# Patient Record
Sex: Female | Born: 1990 | Hispanic: Yes | Marital: Single | Smoking: Never smoker
Health system: Southern US, Community
[De-identification: ages and names within clinical notes are randomized; demographics above are authoritative.]

## PROBLEM LIST (undated history)

## (undated) DIAGNOSIS — Z789 Other specified health status: Secondary | ICD-10-CM

## (undated) HISTORY — PX: NO PAST SURGERIES: SHX2092

---
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
## 2015-06-08 ENCOUNTER — Emergency Department: Payer: BLUE CROSS/BLUE SHIELD

## 2015-06-08 ENCOUNTER — Observation Stay
Admission: EM | Admit: 2015-06-08 | Discharge: 2015-06-09 | Disposition: A | Discharge status: Home / Self Care | Payer: BLUE CROSS/BLUE SHIELD | Attending: Internal Medicine | Admitting: Internal Medicine

## 2015-06-08 ENCOUNTER — Encounter: Payer: Self-pay | Admitting: Emergency Medicine

## 2015-06-08 DIAGNOSIS — R7989 Other specified abnormal findings of blood chemistry: Secondary | ICD-10-CM

## 2015-06-08 DIAGNOSIS — Z79899 Other long term (current) drug therapy: Secondary | ICD-10-CM | POA: Diagnosis not present

## 2015-06-08 DIAGNOSIS — D72829 Elevated white blood cell count, unspecified: Secondary | ICD-10-CM | POA: Diagnosis not present

## 2015-06-08 DIAGNOSIS — R1013 Epigastric pain: Secondary | ICD-10-CM | POA: Diagnosis present

## 2015-06-08 DIAGNOSIS — R1011 Right upper quadrant pain: Secondary | ICD-10-CM

## 2015-06-08 DIAGNOSIS — R109 Unspecified abdominal pain: Secondary | ICD-10-CM | POA: Diagnosis not present

## 2015-06-08 DIAGNOSIS — R000 Tachycardia, unspecified: Principal | ICD-10-CM | POA: Insufficient documentation

## 2015-06-08 DIAGNOSIS — N390 Urinary tract infection, site not specified: Secondary | ICD-10-CM | POA: Diagnosis not present

## 2015-06-08 DIAGNOSIS — R112 Nausea with vomiting, unspecified: Secondary | ICD-10-CM | POA: Insufficient documentation

## 2015-06-08 DIAGNOSIS — E860 Dehydration: Secondary | ICD-10-CM | POA: Diagnosis not present

## 2015-06-08 DIAGNOSIS — K760 Fatty (change of) liver, not elsewhere classified: Secondary | ICD-10-CM | POA: Diagnosis not present

## 2015-06-08 HISTORY — DX: Other specified health status: Z78.9

## 2015-06-08 LAB — URINALYSIS COMPLETE WITH MICROSCOPIC (ARMC ONLY)
Bilirubin Urine: NEGATIVE
Glucose, UA: NEGATIVE mg/dL
HGB URINE DIPSTICK: NEGATIVE
KETONES UR: NEGATIVE mg/dL
NITRITE: NEGATIVE
PROTEIN: 30 mg/dL — AB
SPECIFIC GRAVITY, URINE: 1.024 (ref 1.005–1.030)
pH: 6 (ref 5.0–8.0)

## 2015-06-08 LAB — COMPREHENSIVE METABOLIC PANEL
ALK PHOS: 86 U/L (ref 38–126)
ALT: 23 U/L (ref 14–54)
ANION GAP: 12 (ref 5–15)
AST: 21 U/L (ref 15–41)
Albumin: 4 g/dL (ref 3.5–5.0)
BILIRUBIN TOTAL: 1 mg/dL (ref 0.3–1.2)
BUN: 10 mg/dL (ref 6–20)
CALCIUM: 8.8 mg/dL — AB (ref 8.9–10.3)
CO2: 22 mmol/L (ref 22–32)
Chloride: 99 mmol/L — ABNORMAL LOW (ref 101–111)
Creatinine, Ser: 0.7 mg/dL (ref 0.44–1.00)
Glucose, Bld: 119 mg/dL — ABNORMAL HIGH (ref 65–99)
POTASSIUM: 3.5 mmol/L (ref 3.5–5.1)
Sodium: 133 mmol/L — ABNORMAL LOW (ref 135–145)
TOTAL PROTEIN: 7.8 g/dL (ref 6.5–8.1)

## 2015-06-08 LAB — CBC
HCT: 44.7 % (ref 35.0–47.0)
HEMOGLOBIN: 15.2 g/dL (ref 12.0–16.0)
MCH: 29.7 pg (ref 26.0–34.0)
MCHC: 34.1 g/dL (ref 32.0–36.0)
MCV: 87.2 fL (ref 80.0–100.0)
Platelets: 239 10*3/uL (ref 150–440)
RBC: 5.13 MIL/uL (ref 3.80–5.20)
RDW: 13.7 % (ref 11.5–14.5)
WBC: 12.7 10*3/uL — AB (ref 3.6–11.0)

## 2015-06-08 LAB — TSH: TSH: 0.448 u[IU]/mL (ref 0.350–4.500)

## 2015-06-08 LAB — POCT PREGNANCY, URINE: Preg Test, Ur: NEGATIVE

## 2015-06-08 LAB — LIPASE, BLOOD: Lipase: 17 U/L (ref 11–51)

## 2015-06-08 LAB — FIBRIN DERIVATIVES D-DIMER (ARMC ONLY): Fibrin derivatives D-dimer (ARMC): 1709 — ABNORMAL HIGH (ref 0–499)

## 2015-06-08 LAB — TROPONIN I: Troponin I: <0.03 ng/mL (ref <0.031)

## 2015-06-08 MED ORDER — SODIUM CHLORIDE 0.9 % IV BOLUS (SEPSIS)
1000.0000 mL | Freq: Once | INTRAVENOUS | Status: AC
Start: 2015-06-08 — End: 2015-06-08
  Administered 2015-06-08: 1000 mL via INTRAVENOUS

## 2015-06-08 MED ORDER — SODIUM CHLORIDE 0.9 % IV BOLUS (SEPSIS)
1000.0000 mL | Freq: Once | INTRAVENOUS | Status: AC
  Administered 2015-06-08: 1000 mL via INTRAVENOUS

## 2015-06-08 MED ORDER — IOHEXOL 350 MG/ML SOLN
100.0000 mL | Freq: Once | INTRAVENOUS | Status: AC | PRN
  Administered 2015-06-08: 100 mL via INTRAVENOUS
  Filled 2015-06-08: qty 100

## 2015-06-08 MED ORDER — MORPHINE SULFATE (PF) 4 MG/ML IV SOLN
4.0000 mg | Freq: Once | INTRAVENOUS | Status: DC
  Filled 2015-06-08: qty 1

## 2015-06-08 MED ORDER — ONDANSETRON HCL 4 MG PO TABS: 4.0000 mg | ORAL_TABLET | Freq: Three times a day (TID) | ORAL | Status: AC | PRN

## 2015-06-08 MED ORDER — DICYCLOMINE HCL 20 MG PO TABS: 20.0000 mg | ORAL_TABLET | Freq: Three times a day (TID) | ORAL | Status: AC | PRN

## 2015-06-08 MED ORDER — ONDANSETRON HCL 4 MG/2ML IJ SOLN
4.0000 mg | Freq: Once | INTRAMUSCULAR | Status: DC
  Filled 2015-06-08 (×2): qty 2

## 2015-06-08 NOTE — ED Notes (Signed)
Pt to u/s

## 2015-06-08 NOTE — ED Notes (Signed)
Pt presents with  Acute onset of epigastric pain that woke her up from sleep today at 3am with vomiting and diarrhea.

## 2015-06-08 NOTE — H&P (Signed)
[facility] - [place] at [facility]   PATIENT NAME: Vanessa Vasquez    MR#:  [id number]  DATE OF BIRTH:  09-18-1990  DATE OF ADMISSION:  06/08/2015  PRIMARY CARE PHYSICIAN: No primary care provider on file.   REQUESTING/REFERRING PHYSICIAN: Derrill Kay, M.D.  CHIEF COMPLAINT:   Chief Complaint  Patient presents with  . Abdominal Pain    HISTORY OF PRESENT ILLNESS:  Vanessa Vasquez  is a 24 y.o. female who presents with abdominal pain. Patient came in with nonspecific abdominal pain, was treated with some pain medication and feels much better. She denies any recent nausea or vomiting, or diarrhea. No other infectious symptoms reported. She was tachycardic on admission, and despite getting fair amount of fluids in the ED her tachycardia persisted. It is sinus tach. Workup is largely within normal limits, except for fairly elevated white blood cell count and a significantly elevated d-dimer. However, CT chest did not show any PE or any other infectious etiology or abnormality. Her thyroid levels were normal. Hospitalists were called for evaluation of persistent sinus tachycardia and a very young patient.  PAST MEDICAL HISTORY:   Past Medical History  Diagnosis Date  . Patient denies medical problems     PAST SURGICAL HISTORY:   Past Surgical History  Procedure Laterality Date  . No past surgeries      SOCIAL HISTORY:   Social History  Substance Use Topics  . Smoking status: Never Smoker   . Smokeless tobacco: Not on file  . Alcohol Use: No    FAMILY HISTORY:  No family history on file.  DRUG ALLERGIES:  No Known Allergies  MEDICATIONS AT HOME:   Prior to Admission medications   Medication Sig Start Date End Date Taking? Authorizing Provider  dicyclomine (BENTYL) 20 MG tablet Take 1 tablet (20 mg total) by mouth 3 (three) times daily as needed for spasms. 06/08/15 06/07/16  Phineas Semen, MD  ondansetron (ZOFRAN) 4 MG tablet Take 1 tablet (4 mg  total) by mouth every 8 (eight) hours as needed for nausea or vomiting. 06/08/15   Phineas Semen, MD    REVIEW OF SYSTEMS:  Review of Systems  Constitutional: Negative for fever, chills, weight loss and malaise/fatigue.  HENT: Negative for ear pain, hearing loss and tinnitus.   Eyes: Negative for blurred vision, double vision, pain and redness.  Respiratory: Negative for cough, hemoptysis and shortness of breath.   Cardiovascular: Negative for chest pain, palpitations, orthopnea and leg swelling.  Gastrointestinal: Positive for abdominal pain. Negative for nausea, vomiting, diarrhea and constipation.  Genitourinary: Negative for dysuria, frequency and hematuria.  Musculoskeletal: Negative for back pain, joint pain and neck pain.  Skin:       No acne, rash, or lesions  Neurological: Negative for dizziness, tremors, focal weakness and weakness.  Endo/Heme/Allergies: Negative for polydipsia. Does not bruise/bleed easily.  Psychiatric/Behavioral: Negative for depression. The patient is not nervous/anxious and does not have insomnia.      VITAL SIGNS:   Filed Vitals:   06/08/15 1855 06/08/15 2051 06/08/15 2157 06/08/15 2310  BP: 133/79 124/69 122/74 124/76  Pulse: 125 126 131 119  Temp:      Resp: Height:      Weight:      SpO2: 96% 96% 97% 98%   Wt Readings from Last 3 Encounters:  06/08/15 81.647 kg (180 lb)    PHYSICAL EXAMINATION:  Physical Exam  Vitals reviewed. Constitutional: She is oriented to person, place,  and time. She appears well-developed and well-nourished. No distress.  HENT:  Head: Normocephalic and atraumatic.  Mouth/Throat: Oropharynx is clear and moist.  Eyes: Conjunctivae and EOM are normal. Pupils are equal, round, and reactive to light. No scleral icterus.  Neck: Normal range of motion. Neck supple. No JVD present. No thyromegaly present.  Cardiovascular: Regular rhythm and intact distal pulses.  Exam reveals no gallop and no friction rub.    No murmur heard. Tachycardic  Respiratory: Effort normal and breath sounds normal. No respiratory distress. She has no wheezes. She has no rales.  GI: Soft. Bowel sounds are normal. She exhibits no distension. There is no tenderness.  Musculoskeletal: Normal range of motion. She exhibits no edema.  No arthritis, no gout  Lymphadenopathy:    She has no cervical adenopathy.  Neurological: She is alert and oriented to person, place, and time. No cranial nerve deficit.  No dysarthria, no aphasia  Skin: Skin is warm and dry. No rash noted. No erythema.  Psychiatric: She has a normal mood and affect. Her behavior is normal. Judgment and thought content normal.    LABORATORY PANEL:   CBC  Recent Labs Lab 06/08/15 1325  WBC 12.7*  HGB 15.2  HCT 44.7  PLT 239   ------------------------------------------------------------------------------------------------------------------  Chemistries   Recent Labs Lab 06/08/15 1325  NA 133*  K 3.5  CL 99*  CO2 22  GLUCOSE 119*  BUN 10  CREATININE 0.70  CALCIUM 8.8*  AST 21  ALT 23  ALKPHOS 86  BILITOT 1.0   ------------------------------------------------------------------------------------------------------------------  Cardiac Enzymes  Recent Labs Lab 06/08/15 1325  TROPONINI <0.03   ------------------------------------------------------------------------------------------------------------------  RADIOLOGY:  Ct Angio Chest Pe W/cm &/or Wo Cm  06/08/2015  CLINICAL DATA:  Tachycardia and epigastric pain EXAM: CT ANGIOGRAPHY CHEST WITH CONTRAST TECHNIQUE: Multidetector CT imaging of the chest was performed using the standard protocol during bolus administration of intravenous contrast. Multiplanar CT image reconstructions and MIPs were obtained to evaluate the vascular anatomy. CONTRAST:  OMNIPAQUE IOHEXOL 350 MG/ML SOLN COMPARISON:  None. FINDINGS: The lungs are well aerated bilaterally. Some minimal dependent patchy  atelectatic changes are noted within both lungs. No focal confluent infiltrate is seen. The thoracic inlet is within normal limits. The thoracic aorta and its branches are unremarkable. The pulmonary artery shows a normal branching pattern. No filling defect to suggest pulmonary embolism is identified. The hilar and mediastinal structures show no significant lymphadenopathy. Cardiac structures are within normal limits. The visualized upper abdomen is within normal limits. The osseous structures show no acute abnormality. Review of the MIP images confirms the above findings. IMPRESSION: No evidence of pulmonary emboli. Minimal patchy atelectasis. Electronically Signed   By: Alcide Clever M.D.   On: 06/08/2015 20:52   US Abdomen Limited Ruq  06/08/2015  CLINICAL DATA:  One day history of right upper quadrant pain EXAM: US ABDOMEN LIMITED - RIGHT UPPER QUADRANT COMPARISON:  None. FINDINGS: Gallbladder: No gallstones or wall thickening visualized. There is no pericholecystic fluid. No sonographic Murphy sign noted. Common bile duct: Diameter: 2 mm. There is no intrahepatic or extrahepatic biliary duct dilatation. Liver: No focal lesion identified. Within normal limits in parenchymal echogenicity. IMPRESSION: Increased liver echogenicity, a finding most likely indicative of hepatic steatosis. While no focal liver lesions are identified, it must be cautioned that the sensitivity of ultrasound for focal liver lesions is diminished in this circumstance. Study otherwise unremarkable. Electronically Signed   By: Bretta Bang III M.D.   On: 06/08/2015 15:23  EKG:   Orders placed or performed during the hospital encounter of 06/08/15  . ED EKG  . ED EKG  . EKG 12-Lead  . EKG 12-Lead    IMPRESSION AND PLAN:  Principal Problem:   Tachycardia - sinus tachycardia. Persistently elevated 110s to 130s. Unclear etiology. We'll monitor tonight on telemetry, and get a cardiology consult. Active Problems:    Abdominal pain - unclear etiology. Now improved. Continue monitor.  All the records are reviewed and case discussed with ED provider. Management plans discussed with the patient and/or family.  DVT PROPHYLAXIS: SubQ lovenox  GI PROPHYLAXIS: None  ADMISSION STATUS: Observation  CODE STATUS: Full  TOTAL TIME TAKING CARE OF THIS PATIENT: 35 minutes.    Vanessa Vasquez FIELDING 06/08/2015, 11:34 PM  [facility]  Office  [phone]  CC: Primary care physician; No primary care provider on file.

## 2015-06-08 NOTE — ED Notes (Signed)
Dr. Willis at bedside  

## 2015-06-08 NOTE — ED Notes (Signed)
D-dimer drawn and sent to lab. Patient resting quietly. No concerns from patient at this time.

## 2015-06-08 NOTE — Discharge Instructions (Signed)
Please seek medical attention for any high fevers, chest pain, shortness of breath, change in behavior, persistent vomiting, bloody stool or any other new or concerning symptoms. ° ° °Abdominal Pain, Adult °Many things can cause abdominal pain. Usually, abdominal pain is not caused by a disease and will improve without treatment. It can often be observed and treated at home. Your health care provider will do a physical exam and possibly order blood tests and X-rays to help determine the seriousness of your pain. However, in many cases, more time must pass before a clear cause of the pain can be found. Before that point, your health care provider may not know if you need more testing or further treatment. °HOME CARE INSTRUCTIONS °Monitor your abdominal pain for any changes. The following actions may help to alleviate any discomfort you are experiencing: °· Only take over-the-counter or prescription medicines as directed by your health care provider. °· Do not take laxatives unless directed to do so by your health care provider. °· Try a clear liquid diet (broth, tea, or water) as directed by your health care provider. Slowly move to a bland diet as tolerated. °SEEK MEDICAL CARE IF: °· You have unexplained abdominal pain. °· You have abdominal pain associated with nausea or diarrhea. °· You have pain when you urinate or have a bowel movement. °· You experience abdominal pain that wakes you in the night. °· You have abdominal pain that is worsened or improved by eating food. °· You have abdominal pain that is worsened with eating fatty foods. °· You have a fever. °SEEK IMMEDIATE MEDICAL CARE IF: °· Your pain does not go away within 2 hours. °· You keep throwing up (vomiting). °· Your pain is felt only in portions of the abdomen, such as the right side or the left lower portion of the abdomen. °· You pass bloody or black tarry stools. °MAKE SURE YOU: °· Understand these instructions. °· Will watch your  condition. °· Will get help right away if you are not doing well or get worse. °  °This information is not intended to replace advice given to you by your health care provider. Make sure you discuss any questions you have with your health care provider. °  °Document Released: 03/22/2005 Document Revised: 03/03/2015 Document Reviewed: 02/19/2013 °Elsevier Interactive Patient Education ©2016 [employer] ° °

## 2015-06-08 NOTE — ED Notes (Signed)
Pt requesting meal tray and orange juice. Per MD, pt okay to have [place] tray and orange juice.

## 2015-06-08 NOTE — ED Provider Notes (Signed)
[facility] Emergency Department Provider Note    ____________________________________________  Time seen: 1445  I have reviewed the triage vital signs and the nursing notes.   HISTORY  Chief Complaint Abdominal Pain   History limited by: Not Limited   HPI Vanessa NearingBriana Sturkey is a 24 y.o. female with no significant past medical history presents to the emergency department today because of concerns for abdominal pain. She states the pain started almost 12 hours ago. It woke her up from sleep. She states that the pain was in the epigastric region. It was sharp. It was severe. Since that time and has moved to the periumbilical region. She states continues to be severe. She has had some nausea and vomiting. She states the vomiting does help relieve the pain slightly. She has not had any fevers. Had a bowel movement a couple of hours ago which did have some diarrhea however no blood.   History reviewed. No pertinent past medical history.  There are no active problems to display for this patient.   History reviewed. No pertinent past surgical history.  No current outpatient prescriptions on file.  Allergies Review of patient's allergies indicates no known allergies.  No family history on file.  Social History Social History  Substance Use Topics  . Smoking status: Never Smoker   . Smokeless tobacco: None  . Alcohol Use: No    Review of Systems  Constitutional: Negative for fever. Cardiovascular: Negative for chest pain. Respiratory: Negative for shortness of breath. Gastrointestinal: Positive for abdominal pain, vomiting Neurological: Negative for headaches, focal weakness or numbness.  10-point ROS otherwise negative.  ____________________________________________   PHYSICAL EXAM:  VITAL SIGNS: ED Triage Vitals  Enc Vitals Group     BP 06/08/15 1319 133/80 mmHg     Pulse Rate 06/08/15 1319 130     Resp 06/08/15 1319 20     Temp 06/08/15  1319 98.4 F (36.9 C)     Temp src --      SpO2 06/08/15 1319 96 %     Weight 06/08/15 1319 180 lb (81.647 kg)     Height 06/08/15 1319 5\' 1"  (1.549 m)     Head Cir --      Peak Flow --      Pain Score 06/08/15 1322 8   Constitutional: Alert and oriented. Well appearing and in no distress. Eyes: Conjunctivae are normal. PERRL. Normal extraocular movements. ENT   Head: Normocephalic and atraumatic.   Nose: No congestion/rhinnorhea.   Mouth/Throat: Mucous membranes are moist.   Neck: No stridor. Hematological/Lymphatic/Immunilogical: No cervical lymphadenopathy. Cardiovascular: Normal rate, regular rhythm.  No murmurs, rubs, or gallops. Respiratory: Normal respiratory effort without tachypnea nor retractions. Breath sounds are clear and equal bilaterally. No wheezes/rales/rhonchi. Gastrointestinal: Soft. Tender to palpation in the epigastric and periumbilical region. No rebound. No guarding. No tenderness to percussion. Genitourinary: Deferred Musculoskeletal: Normal range of motion in all extremities. No joint effusions.  No lower extremity tenderness nor edema. Neurologic:  Normal speech and language. No gross focal neurologic deficits are appreciated.  Skin:  Skin is warm, dry and intact. No rash noted. Psychiatric: Mood and affect are normal. Speech and behavior are normal. Patient exhibits appropriate insight and judgment.  ____________________________________________    LABS (pertinent positives/negatives)  Labs Reviewed  COMPREHENSIVE METABOLIC PANEL - Abnormal; Notable for the following:    Sodium 133 (*)    Chloride 99 (*)    Glucose, Bld 119 (*)    Calcium 8.8 (*)  All other components within normal limits  CBC - Abnormal; Notable for the following:    WBC 12.7 (*)    All other components within normal limits  URINALYSIS COMPLETEWITH MICROSCOPIC ([facility] ONLY) - Abnormal; Notable for the following:    Color, Urine YELLOW (*)    APPearance HAZY (*)     Protein, ur 30 (*)    Leukocytes, UA TRACE (*)    Bacteria, UA RARE (*)    Squamous Epithelial / LPF 6-30 (*)    All other components within normal limits  FIBRIN DERIVATIVES D-DIMER ([facility] ONLY) - Abnormal; Notable for the following:    Fibrin derivatives D-dimer ([facility]) 1709 (*)    All other components within normal limits  URINE CULTURE  LIPASE, BLOOD  TROPONIN I  TSH  POC URINE PREG, ED  POCT PREGNANCY, URINE     ____________________________________________   EKG  I, Phineas Semen, attending physician, personally viewed and interpreted this EKG  EKG Time: 1327 Rate: 126 Rhythm: sinus tachycardia Axis: normal Intervals: qtc 448 QRS: narrow ST changes: no st elevation Impression: sinus tachycardia, otherwise normal ekg ____________________________________________    RADIOLOGY  CTA PE IMPRESSION: No evidence of pulmonary emboli.  Minimal patchy atelectasis.  RUQ US  IMPRESSION: Increased liver echogenicity, a finding most likely indicative of hepatic steatosis. While no focal liver lesions are identified, it must be cautioned that the sensitivity of ultrasound for focal liver lesions is diminished in this circumstance. Study otherwise unremarkable.  ____________________________________________   PROCEDURES  Procedure(s) performed: None  Critical Care performed: No  ____________________________________________   INITIAL IMPRESSION / ASSESSMENT AND PLAN / ED COURSE  Pertinent labs & imaging results that were available during my care of the patient were reviewed by me and considered in my medical decision making (see chart for details).  Patient presented to the emergency department today with epigastric pain. Initial exam did show some tenderness over the epigastric area. A right upper quadrant ultrasound was performed which did not show any gallbladder disease. Patient was given a liter of fluids. Patient's heart rate did not improve after a liter  fluid. Another liter was given and again the patient's heart rate did not change. Because of this further workup was required. D-dimer was elevated however CT angiogram of the chest did not show any pulmonary embolism. Additionally TSH was checked and was within normal limits. A third liter of fluid was given the patient's heart rate continued to be quite elevated. Because of this patient was admitted to the hospitalist service for further workup and evaluation.  ____________________________________________   FINAL CLINICAL IMPRESSION(S) / ED DIAGNOSES  Final diagnoses:  RUQ pain  Epigastric pain  Elevated d-dimer  Tachycardia     Phineas Semen, MD 06/08/15 2231

## 2015-06-08 NOTE — ED Notes (Signed)
Patient transported to CT via stretcher.

## 2015-06-08 NOTE — ED Notes (Signed)
Pt heart rate still 129,  md made aware, second order of IV fluids ordered and started.  Will hold d/c instructions until hr down and md clears pt.

## 2015-06-09 ENCOUNTER — Observation Stay: Admit: 2015-06-09 | Payer: BLUE CROSS/BLUE SHIELD

## 2015-06-09 LAB — BASIC METABOLIC PANEL
Anion gap: 5 (ref 5–15)
BUN: 8 mg/dL (ref 6–20)
CHLORIDE: 106 mmol/L (ref 101–111)
CO2: 25 mmol/L (ref 22–32)
CREATININE: 0.59 mg/dL (ref 0.44–1.00)
Calcium: 8.3 mg/dL — ABNORMAL LOW (ref 8.9–10.3)
Glucose, Bld: 113 mg/dL — ABNORMAL HIGH (ref 65–99)
POTASSIUM: 3.5 mmol/L (ref 3.5–5.1)
SODIUM: 136 mmol/L (ref 135–145)

## 2015-06-09 LAB — CBC
HCT: 38.7 % (ref 35.0–47.0)
Hemoglobin: 13.3 g/dL (ref 12.0–16.0)
MCH: 30.5 pg (ref 26.0–34.0)
MCHC: 34.3 g/dL (ref 32.0–36.0)
MCV: 88.8 fL (ref 80.0–100.0)
PLATELETS: 191 10*3/uL (ref 150–440)
RBC: 4.36 MIL/uL (ref 3.80–5.20)
RDW: 13.8 % (ref 11.5–14.5)
WBC: 9.3 10*3/uL (ref 3.6–11.0)

## 2015-06-09 MED ORDER — ACETAMINOPHEN 650 MG RE SUPP
650.0000 mg | Freq: Four times a day (QID) | RECTAL | Status: DC | PRN
Start: 2015-06-09 — End: 2015-06-09

## 2015-06-09 MED ORDER — ACETAMINOPHEN 325 MG PO TABS: 650.0000 mg | ORAL_TABLET | Freq: Four times a day (QID) | ORAL | Status: DC | PRN

## 2015-06-09 MED ORDER — CEFUROXIME AXETIL 250 MG PO TABS
250.0000 mg | ORAL_TABLET | Freq: Two times a day (BID) | ORAL | Status: DC
  Administered 2015-06-09: 250 mg via ORAL
  Filled 2015-06-09: qty 1

## 2015-06-09 MED ORDER — ONDANSETRON HCL 4 MG/2ML IJ SOLN
4.0000 mg | Freq: Four times a day (QID) | INTRAMUSCULAR | Status: DC | PRN
  Administered 2015-06-09: 4 mg via INTRAVENOUS

## 2015-06-09 MED ORDER — CEFUROXIME AXETIL 500 MG PO TABS: 500.0000 mg | ORAL_TABLET | Freq: Two times a day (BID) | ORAL | Status: AC

## 2015-06-09 MED ORDER — SODIUM CHLORIDE 0.9 % IJ SOLN
3.0000 mL | Freq: Two times a day (BID) | INTRAMUSCULAR | Status: DC
  Administered 2015-06-09: 3 mL via INTRAVENOUS

## 2015-06-09 MED ORDER — CEFUROXIME AXETIL 250 MG PO TABS: 250.0000 mg | ORAL_TABLET | Freq: Two times a day (BID) | ORAL | Status: DC

## 2015-06-09 MED ORDER — ONDANSETRON HCL 4 MG PO TABS: 4.0000 mg | ORAL_TABLET | Freq: Four times a day (QID) | ORAL | Status: DC | PRN

## 2015-06-09 MED ORDER — SODIUM CHLORIDE 0.9 % IV SOLN
INTRAVENOUS | Status: AC
  Administered 2015-06-09: 01:00:00 via INTRAVENOUS

## 2015-06-09 MED ORDER — ENOXAPARIN SODIUM 40 MG/0.4ML ~~LOC~~ SOLN: 40.0000 mg | SUBCUTANEOUS | Status: DC

## 2015-06-09 MED ORDER — MORPHINE SULFATE (PF) 2 MG/ML IV SOLN: 2.0000 mg | INTRAVENOUS | Status: DC | PRN

## 2015-06-09 NOTE — Progress Notes (Signed)
Discharge instructions given to patient. IV and tele removed. Ceftin prescription was sent to [facility], confirmed it is there. There were two other prescriptions listed on AVS. Called Dr. Juliene PinaMody about them and MD stated those were put on there by mistake by someone other than her, patient does not need them. Marked out and put a note on AVS. Patient instructed to take full course of ceftin. Given education on tachycardia and abdominal pain. Care management gave list of current providers accepting patients for patient to find a new PCP. Ride is here and patient is getting dressed to leave now.

## 2015-06-09 NOTE — Progress Notes (Signed)
Patient arrived to 2A Room 254. Patient denies pain and all questions answered. Patient oriented to unit and Fall Safety Plan signed. Skin assessment completed with Farley Lyachel RN. Skin intact. Nursing staff will continue to monitor. Lamonte RicherKara A Lauretta Sallas, RN

## 2015-06-09 NOTE — Discharge Summary (Signed)
[facility] [place] at [facility]   PATIENT NAME: Vanessa NearingBriana Watts    MR#:  [id number]  DATE OF BIRTH:  01/11/1991  DATE OF ADMISSION:  06/08/2015 ADMITTING PHYSICIAN: Oralia Manisavid Willis, MD  DATE OF DISCHARGE: 06/09/2015   PRIMARY CARE PHYSICIAN: No primary care provider on file.    ADMISSION DIAGNOSIS:  Epigastric pain [R10.13] Tachycardia [R00.0] RUQ pain [R10.11] Elevated d-dimer [R79.1]  DISCHARGE DIAGNOSIS:  Principal Problem:   Tachycardia Active Problems:   Abdominal pain   SECONDARY DIAGNOSIS:   Past Medical History  Diagnosis Date  . Patient denies medical problems     HOSPITAL COURSE:   24 year old female presented with abdominal pain upon a sinus tachycardia. For further details please refer the H&P.   1. Sinus tachycardia: Patient presented with persistently elevated heart rates in 110s to 130s. Patient's heart rates have subsequently slightly improved. Her thyroid level was normal. She denies EtOH or illicit drug use. She did have a urinary tract infection. She denies anxiety or pain. Cardiology was consulted and Dr Lady GaryFath felt that her tachycardia was benign. It was due to dehydration due to recent nausea and vomiting along with UTI.  2. Abdominal pain: Patient underwent ultrasound of the abdomen which showed hepatic steatosis. Her abdominal pain has subsided.  DISCHARGE CONDITIONS AND DIET:   Home on a regular diet in stable condition  CONSULTS OBTAINED:  Treatment Team:  Dalia HeadingKenneth A Fath, MD  DRUG ALLERGIES:  No Known Allergies  DISCHARGE MEDICATIONS:   Current Discharge Medication List    START taking these medications   Details  cefUROXime (CEFTIN) 500 MG tablet Take 1 tablet (500 mg total) by mouth 2 (two) times daily with a meal. Qty: 10 tablet, Refills: 0    dicyclomine (BENTYL) 20 MG tablet Take 1 tablet (20 mg total) by mouth 3 (three) times daily as needed for spasms. Qty: 30 tablet, Refills: 0    ondansetron  (ZOFRAN) 4 MG tablet Take 1 tablet (4 mg total) by mouth every 8 (eight) hours as needed for nausea or vomiting. Qty: 20 tablet, Refills: 0              Today   CHIEF COMPLAINT:  Patient denies chest pain or shortness of breath or abdominal pain. No nausea. Although nurse reports that she had nausea this morning.   VITAL SIGNS:  Blood pressure 128/80, pulse 107, temperature 99.1 F (37.3 C), temperature source Oral, resp. rate 16, height 5\' 1"  (1.549 m), weight 90.765 kg (200 lb 1.6 oz), last menstrual period 04/08/2015, SpO2 97 %.   REVIEW OF SYSTEMS:  Review of Systems  Constitutional: Negative for fever, chills and malaise/fatigue.  HENT: Negative for sore throat.   Eyes: Negative for blurred vision.  Respiratory: Negative for cough, hemoptysis, shortness of breath and wheezing.   Cardiovascular: Negative for chest pain, palpitations and leg swelling.  Gastrointestinal: Negative for nausea, vomiting, abdominal pain, diarrhea and blood in stool.  Genitourinary: Negative for dysuria.  Musculoskeletal: Negative for back pain.  Neurological: Negative for dizziness, tremors and headaches.  Endo/Heme/Allergies: Does not bruise/bleed easily.     PHYSICAL EXAMINATION:  GENERAL:  24 y.o.-year-old patient lying in the bed with no acute distress.  NECK:  Supple, no jugular venous distention. No thyroid enlargement, no tenderness.  LUNGS: Normal breath sounds bilaterally, no wheezing, rales,rhonchi  No use of accessory muscles of respiration.  CARDIOVASCULAR: S1, S2 normal. No murmurs, rubs, or gallops.  ABDOMEN: Soft, non-tender, non-distended. Bowel sounds present. No  organomegaly or mass.  EXTREMITIES: No pedal edema, cyanosis, or clubbing.  PSYCHIATRIC: The patient is alert and oriented x 3.  SKIN: No obvious rash, lesion, or ulcer.   DATA REVIEW:   CBC  Recent Labs Lab 06/09/15 0546  WBC 9.3  HGB 13.3  HCT 38.7  PLT 191    Chemistries   Recent Labs Lab  06/08/15 1325 06/09/15 0546  NA 133* 136  K 3.5 3.5  CL 99* 106  CO2 22 25  GLUCOSE 119* 113*  BUN 10 8  CREATININE 0.70 0.59  CALCIUM 8.8* 8.3*  AST 21  --   ALT 23  --   ALKPHOS 86  --   BILITOT 1.0  --     Cardiac Enzymes  Recent Labs Lab 06/08/15 1325  TROPONINI <0.03    Microbiology Results  @  RADIOLOGY:  Ct Angio Chest Pe W/cm &/or Wo Cm  06/08/2015  CLINICAL DATA:  Tachycardia and epigastric pain EXAM: CT ANGIOGRAPHY CHEST WITH CONTRAST TECHNIQUE: Multidetector CT imaging of the chest was performed using the standard protocol during bolus administration of intravenous contrast. Multiplanar CT image reconstructions and MIPs were obtained to evaluate the vascular anatomy. CONTRAST:  OMNIPAQUE IOHEXOL 350 MG/ML SOLN COMPARISON:  None. FINDINGS: The lungs are well aerated bilaterally. Some minimal dependent patchy atelectatic changes are noted within both lungs. No focal confluent infiltrate is seen. The thoracic inlet is within normal limits. The thoracic aorta and its branches are unremarkable. The pulmonary artery shows a normal branching pattern. No filling defect to suggest pulmonary embolism is identified. The hilar and mediastinal structures show no significant lymphadenopathy. Cardiac structures are within normal limits. The visualized upper abdomen is within normal limits. The osseous structures show no acute abnormality. Review of the MIP images confirms the above findings. IMPRESSION: No evidence of pulmonary emboli. Minimal patchy atelectasis. Electronically Signed   By: Alcide Clever M.D.   On: 06/08/2015 20:52   US Abdomen Limited Ruq  06/08/2015  CLINICAL DATA:  One day history of right upper quadrant pain EXAM: US ABDOMEN LIMITED - RIGHT UPPER QUADRANT COMPARISON:  None. FINDINGS: Gallbladder: No gallstones or wall thickening visualized. There is no pericholecystic fluid. No sonographic Murphy sign noted. Common bile duct: Diameter: 2 mm. There  is no intrahepatic or extrahepatic biliary duct dilatation. Liver: No focal lesion identified. Within normal limits in parenchymal echogenicity. IMPRESSION: Increased liver echogenicity, a finding most likely indicative of hepatic steatosis. While no focal liver lesions are identified, it must be cautioned that the sensitivity of ultrasound for focal liver lesions is diminished in this circumstance. Study otherwise unremarkable. Electronically Signed   By: Bretta Bang III M.D.   On: 06/08/2015 15:23      Management plans discussed with the patient and she is in agreement. Stable for discharge   Patient should follow up with [facility] in 2 days  CODE STATUS:     Code Status Orders        Start     Ordered   06/09/15 0017  Full code   Continuous     06/09/15 0016      TOTAL TIME TAKING CARE OF THIS PATIENT: 35 minutes.    Note: This dictation was prepared with Dragon dictation along with smaller phrase technology. Any transcriptional errors that result from this process are unintentional.  Shaylan Tutton M.D on 06/09/2015 at 10:36 AM  Between 7am to 6pm - Pager - [phone] After 6pm go to [email or web address] - password [email or web address]  [facility]  Office  [phone]  CC: Primary care physician; No primary care provider on file.

## 2015-06-09 NOTE — Consult Note (Signed)
ANTIBIOTIC CONSULT NOTE - INITIAL  Pharmacy Consult for ceftin Indication: UTI  No Known Allergies  Patient Measurements: Height: 5\' 1"  (154.9 cm) Weight: 200 lb 1.6 oz (90.765 kg) (verified by [place]) IBW/kg (Calculated) : 47.8 Adjusted Body Weight:   Vital Signs: Temp: 99.2 F (37.3 C) (12/14 0514) Temp Source: Oral (12/14 0514) BP: 132/77 mmHg (12/14 0514) Pulse Rate: 106 (12/14 0514) Intake/Output from previous day: 12/13 0701 - 12/14 0700 In: 426.3 [I.V.:426.3] Out: 100 [Urine:100] Intake/Output from this shift:    Labs:  Recent Labs  06/08/15 1325 06/09/15 0546  WBC 12.7* 9.3  HGB 15.2 13.3  PLT 239 191  CREATININE 0.70 0.59   Estimated Creatinine Clearance: 112.2 mL/min (by C-G formula based on Cr of 0.59). No results for input(s): VANCOTROUGH, VANCOPEAK, VANCORANDOM, GENTTROUGH, GENTPEAK, GENTRANDOM, TOBRATROUGH, TOBRAPEAK, TOBRARND, AMIKACINPEAK, AMIKACINTROU, AMIKACIN in the last 72 hours.   Microbiology: No results found for this or any previous visit (from the past 720 hour(s)).  Medical History: Past Medical History  Diagnosis Date  . Patient denies medical problems     Medications:  Scheduled:  . cefUROXime  250 mg Oral BID WC  . enoxaparin (LOVENOX) injection  40 mg Subcutaneous Q24H  .  morphine injection  4 mg Intravenous Once  . ondansetron (ZOFRAN) IV  4 mg Intravenous Once  . sodium chloride  3 mL Intravenous Q12H   Assessment: Pt is a 24 year old female, overall healthy individual who presents with abdominal pain and tachycardia. Pt urinalysis is positive for UTI. Pharmacy was consulted to dose ceftin.  Goal of Therapy:  resolution of infection  Plan:  Follow up culture results continue ceftin 250mg  BID for 7 days. make changes as needed per culture data  Olene FlossMelissa D Burdett Pinzon 06/09/2015,7:43 AM

## 2015-06-11 LAB — URINE CULTURE

## 2015-11-30 ENCOUNTER — Ambulatory Visit
Admission: EM | Admit: 2015-11-30 | Discharge: 2015-11-30 | Disposition: A | Discharge status: Home / Self Care | Payer: BLUE CROSS/BLUE SHIELD | Attending: Family Medicine | Admitting: Family Medicine

## 2015-11-30 ENCOUNTER — Encounter: Payer: Self-pay | Admitting: Gynecology

## 2015-11-30 DIAGNOSIS — J019 Acute sinusitis, unspecified: Secondary | ICD-10-CM | POA: Diagnosis not present

## 2015-11-30 DIAGNOSIS — J040 Acute laryngitis: Secondary | ICD-10-CM | POA: Diagnosis not present

## 2015-11-30 MED ORDER — AZITHROMYCIN 250 MG PO TABS: ORAL_TABLET | ORAL | Status: AC

## 2015-11-30 MED ORDER — FEXOFENADINE-PSEUDOEPHED ER 180-240 MG PO TB24: 1.0000 | ORAL_TABLET | Freq: Every day | ORAL | Status: AC

## 2015-11-30 MED ORDER — BENZONATATE 200 MG PO CAPS: 200.0000 mg | ORAL_CAPSULE | Freq: Three times a day (TID) | ORAL | Status: AC | PRN

## 2015-11-30 NOTE — ED Notes (Signed)
Patient c/o cough x 3 weeks. Patient c/o nasal drip.

## 2015-11-30 NOTE — ED Provider Notes (Signed)
CSN: [id number]     Arrival date & time 11/30/15  1606 History   First MD Initiated Contact with Patient 11/30/15 1725    Nurses notes were reviewed.  Chief Complaint  Patient presents with  . Cough   Patient reports cough and congestion going on for about 2 weeks. She reports recurrent cough and nasal congestion but things got worse yesterday because it hoarseness and difficulty talking. She states she's coughing up whitish clear mucus when she does cough she feels pressure in her sinuses as well. She states the sore throat more from irritation than actual soreness  No known drug allergies she does not smoke no past surgeries no significant past medical history and there is diabetes in the family.     (Consider location/radiation/quality/duration/timing/severity/associated sxs/prior Treatment) Patient is a 25 y.o. female presenting with cough. The history is provided by the patient. No language interpreter was used.  Cough Cough characteristics:  Productive Sputum characteristics:  White and frothy Severity:  Moderate Duration:  2 weeks Timing:  Constant Progression:  Worsening Chronicity:  New Context: upper respiratory infection   Relieved by:  Nothing Worsened by:  Nothing tried Associated symptoms: rhinorrhea, sinus congestion and sore throat   Associated symptoms: no chest pain, no chills, no diaphoresis, no ear pain, no fever, no myalgias, no rash and no wheezing     Past Medical History  Diagnosis Date  . Patient denies medical problems    Past Surgical History  Procedure Laterality Date  . No past surgeries     Family History  Problem Relation Age of Onset  . Diabetes     Social History  Substance Use Topics  . Smoking status: Never Smoker   . Smokeless tobacco: None  . Alcohol Use: No   OB History    No data available     Review of Systems  Constitutional: Negative for fever, chills and diaphoresis.  HENT: Positive for rhinorrhea and sore throat.  Negative for ear pain.   Respiratory: Positive for cough. Negative for wheezing.   Cardiovascular: Negative for chest pain.  Musculoskeletal: Negative for myalgias.  Skin: Negative for rash.  All other systems reviewed and are negative.   Allergies  Review of patient's allergies indicates no known allergies.  Home Medications   Prior to Admission medications   Medication Sig Start Date End Date Taking? Authorizing Provider  azithromycin (ZITHROMAX Z-PAK) 250 MG tablet Take 2 tablets first day and then 1 po a day for 4 days 11/30/15   Hassan Rowan, MD  benzonatate (TESSALON) 200 MG capsule Take 1 capsule (200 mg total) by mouth 3 (three) times daily as needed for cough. 11/30/15   Hassan Rowan, MD  cefUROXime (CEFTIN) 500 MG tablet Take 1 tablet (500 mg total) by mouth 2 (two) times daily with a meal. 06/09/15   Adrian Saran, MD  dicyclomine (BENTYL) 20 MG tablet Take 1 tablet (20 mg total) by mouth 3 (three) times daily as needed for spasms. 06/08/15 06/07/16  Phineas Semen, MD  fexofenadine-pseudoephedrine (ALLEGRA-D ALLERGY & CONGESTION) 180-240 MG 24 hr tablet Take 1 tablet by mouth daily. 11/30/15   Hassan Rowan, MD  ondansetron (ZOFRAN) 4 MG tablet Take 1 tablet (4 mg total) by mouth every 8 (eight) hours as needed for nausea or vomiting. 06/08/15   Phineas Semen, MD   Meds Ordered and Administered this Visit  Medications - No data to display  BP 131/79 mmHg  Pulse 74  Temp(Src) 98 F (36.7 C) (Oral)  Resp 16  Ht 5\' 1"  (1.549 m)  Wt 185 lb (83.915 kg)  BMI 34.97 kg/m2  SpO2 98%  LMP 10/30/2015 No data found.   Physical Exam  Constitutional: She is oriented to person, place, and time. She appears well-developed and well-nourished.  HENT:  Head: Normocephalic and atraumatic.  Right Ear: Hearing, tympanic membrane, external ear and ear canal normal.  Left Ear: Hearing and external ear normal.  Nose: Mucosal edema and rhinorrhea present.  Mouth/Throat: Uvula is midline. Normal  dentition. No dental caries. Posterior oropharyngeal erythema present.  Eyes: Conjunctivae are normal. Pupils are equal, round, and reactive to light.  Neck: Normal range of motion. Neck supple.  Cardiovascular: Normal rate and regular rhythm.   Pulmonary/Chest: Effort normal and breath sounds normal.  Musculoskeletal: Normal range of motion. She exhibits no edema.  Neurological: She is alert and oriented to person, place, and time.  Skin: Skin is warm and dry. She is not diaphoretic.  Psychiatric: She has a normal mood and affect.  Vitals reviewed.   ED Course  Procedures (including critical care time)  Labs Review Labs Reviewed - No data to display  Imaging Review No results found.   Visual Acuity Review  Right Eye Distance:   Left Eye Distance:   Bilateral Distance:    Right Eye Near:   Left Eye Near:    Bilateral Near:         MDM   1. Acute sinusitis, recurrence not specified, unspecified location   2. Laryngitis   Place on Z-Pak as directed Allegra-D 1 tablet a day also place and Tessalon Perles 20 mg one by mouth every 8 hours when necessary for the cough return here or follow-up PCP if not better in 3-4 days. Off work note patient declined.  Hassan RowanEugene Amora Sheehy, MD 11/30/15 1750

## 2015-11-30 NOTE — Discharge Instructions (Signed)
Laryngitis Laryngitis is swelling (inflammation) of your vocal cords. This causes hoarseness, coughing, loss of voice, sore throat, or a dry throat. When your vocal cords are inflamed, your voice sounds different. Laryngitis can be temporary (acute) or long-term (chronic). Most cases of acute laryngitis improve with time. Chronic laryngitis is laryngitis that lasts for more than three weeks. HOME CARE  Drink enough fluid to keep your pee (urine) clear or pale yellow.  Breathe in moist air. Use a humidifier if you live in a dry climate.  Take medicines only as told by your doctor.  Do not smoke cigarettes or electronic cigarettes. If you need help quitting, ask your doctor.  Talk as little as possible. Also avoid whispering, which can cause vocal strain.  Write instead of talking. Do this until your voice is back to normal. GET HELP IF:  You have a fever.  Your pain is worse.  You have trouble swallowing. GET HELP RIGHT AWAY IF:  You cough up blood.  You have trouble breathing.   This information is not intended to replace advice given to you by your health care provider. Make sure you discuss any questions you have with your health care provider.   Document Released: 06/01/2011 Document Revised: 07/03/2014 Document Reviewed: 11/25/2013 Elsevier Interactive Patient Education 2016 [employer]  Sinusitis, Adult Sinusitis is redness, soreness, and puffiness (inflammation) of the air pockets in the bones of your face (sinuses). The redness, soreness, and puffiness can cause air and mucus to get trapped in your sinuses. This can allow germs to grow and cause an infection.  HOME CARE   Drink enough fluids to keep your pee (urine) clear or pale yellow.  Use a humidifier in your home.  Run a hot shower to create steam in the bathroom. Sit in the bathroom with the door closed. Breathe in the steam 3-4 times a day.  Put a warm, moist washcloth on your face 3-4 times a day, or as  told by your doctor.  Use salt water sprays (saline sprays) to wet the thick fluid in your nose. This can help the sinuses drain.  Only take medicine as told by your doctor. GET HELP RIGHT AWAY IF:   Your pain gets worse.  You have very bad headaches.  You are sick to your stomach (nauseous).  You throw up (vomit).  You are very sleepy (drowsy) all the time.  Your face is puffy (swollen).  Your vision changes.  You have a stiff neck.  You have trouble breathing. MAKE SURE YOU:   Understand these instructions.  Will watch your condition.  Will get help right away if you are not doing well or get worse.   This information is not intended to replace advice given to you by your health care provider. Make sure you discuss any questions you have with your health care provider.   Document Released: 11/29/2007 Document Revised: 07/03/2014 Document Reviewed: 01/16/2012 Elsevier Interactive Patient Education [employer] Elsevier Inc.

## 2017-05-06 IMAGING — CT CT ANGIO CHEST
1 of 2 series · 18 of 30 positions shown · IV contrast (APPLIED)
Comparison: None.

CLINICAL DATA: Tachycardia and epigastric pain

EXAM:
CT ANGIOGRAPHY CHEST WITH CONTRAST
TECHNIQUE: Multidetector CT imaging of the chest was performed using the
standard protocol during bolus administration of intravenous
contrast. Multiplanar CT image reconstructions and MIPs were
obtained to evaluate the vascular anatomy.
CONTRAST:  100mL OMNIPAQUE IOHEXOL 350 MG/ML SOLN

[Series 5: pe 1.0 thins · axial · 0.77mm/px · z∈[-566,-297]mm · 18 of 303 slices shown]
[im 17/303  lung]
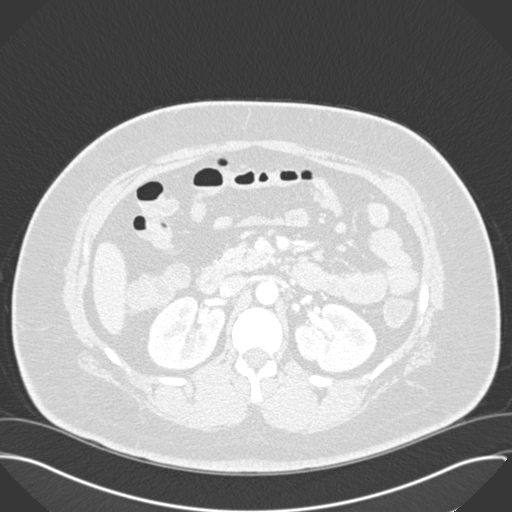
[im 34/303  mediastinal]
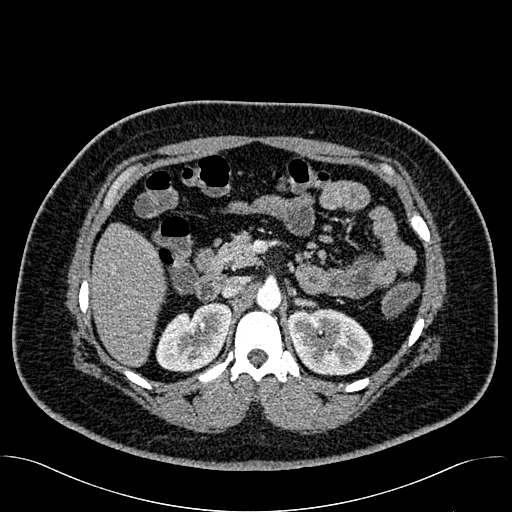
[im 51/303  lung]
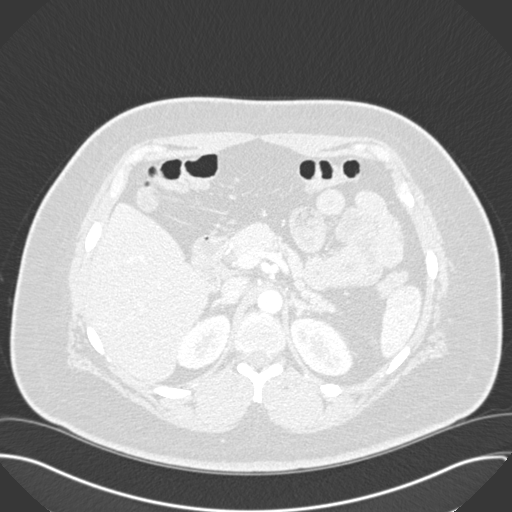
[im 68/303  mediastinal]
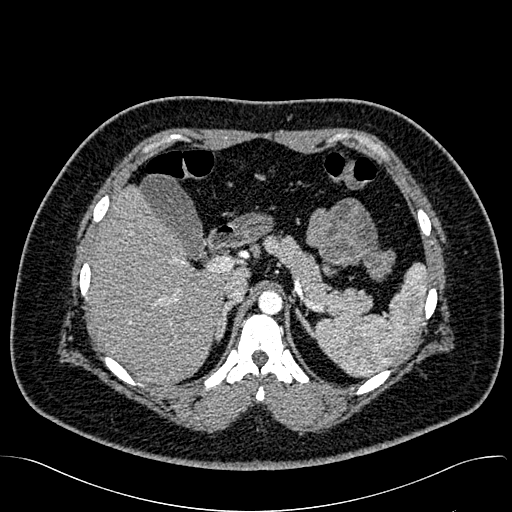
[im 84/303  lung]
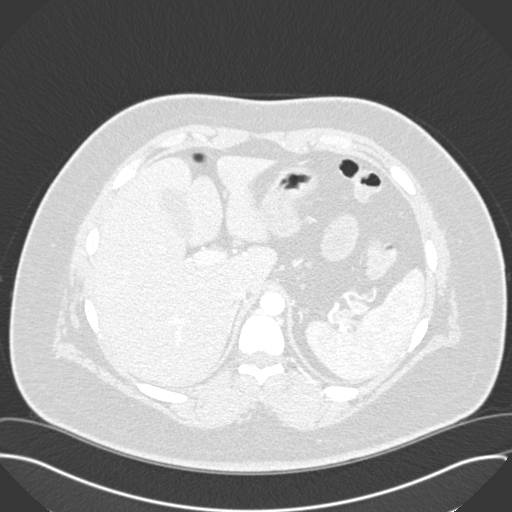
[im 101/303  mediastinal]
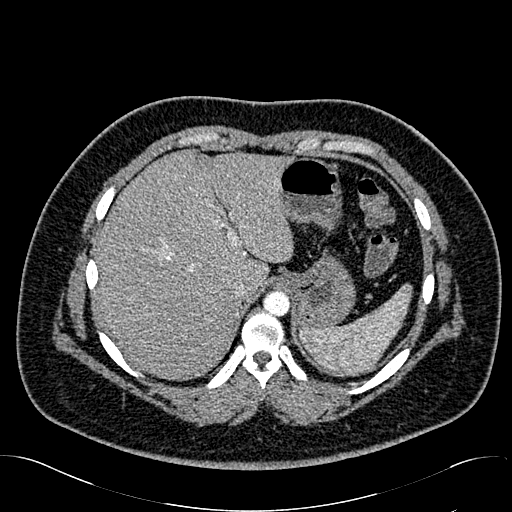
[im 118/303  lung]
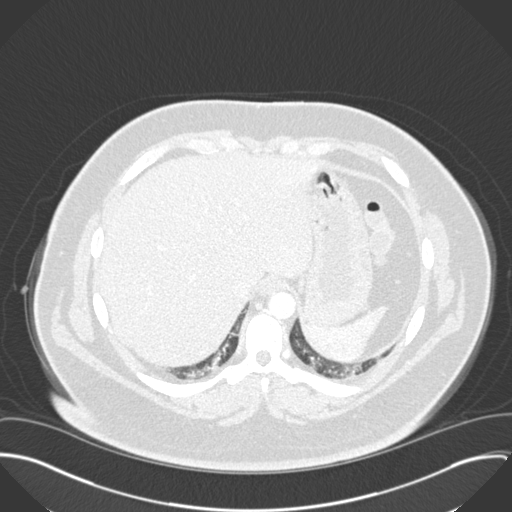
[im 135/303  mediastinal]
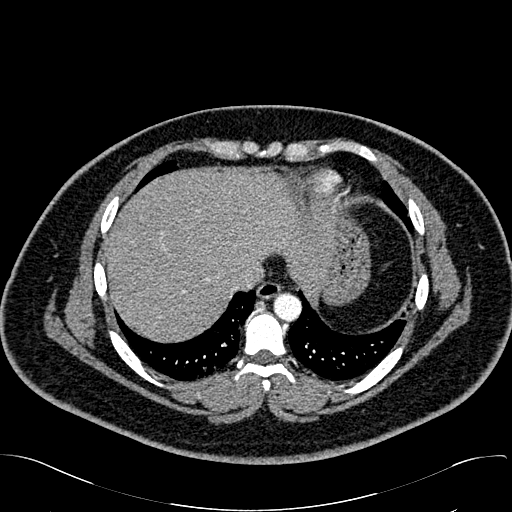
[im 142/303  lung]
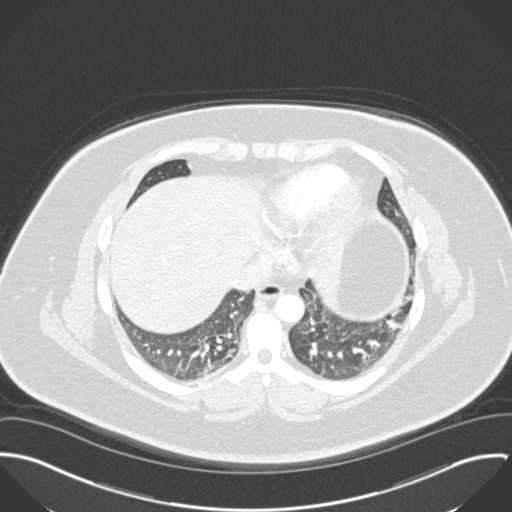
[im 152/303  mediastinal]
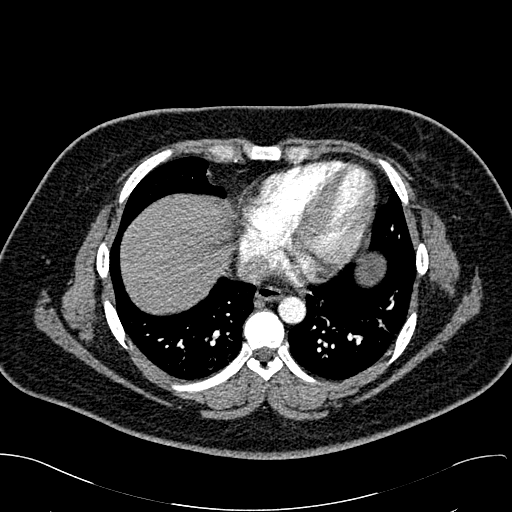
[im 168/303  lung]
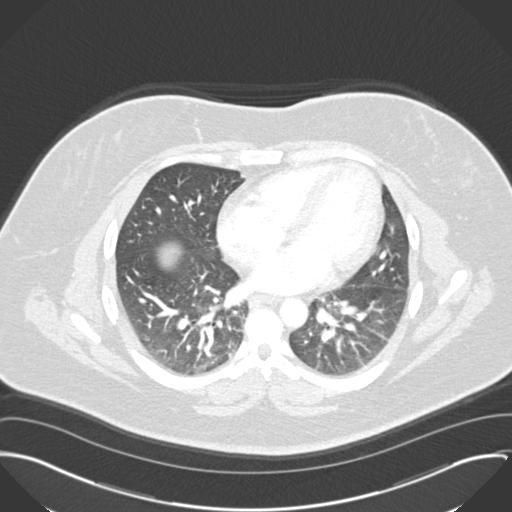
[im 185/303  mediastinal]
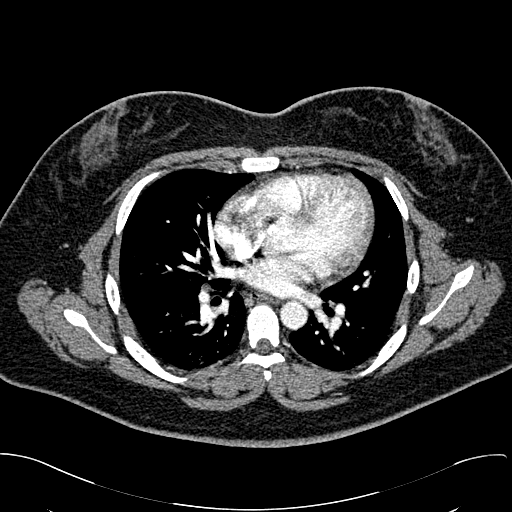
[im 202/303  lung]
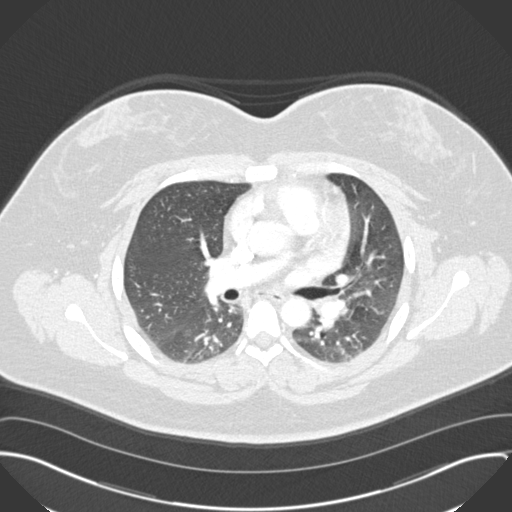
[im 219/303  mediastinal]
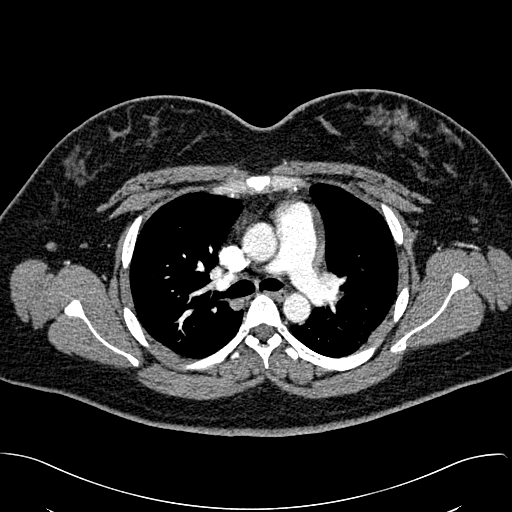
[im 235/303  lung]
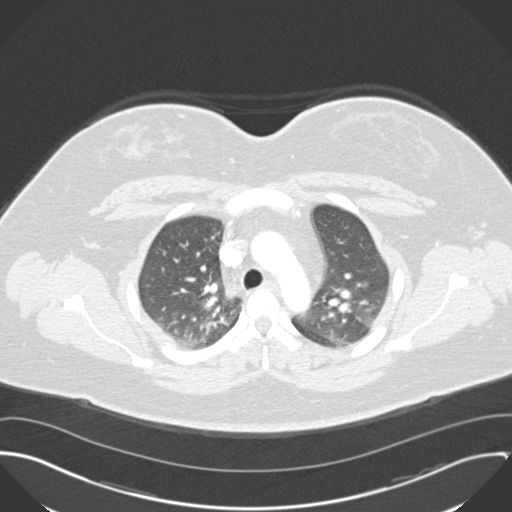
[im 252/303  mediastinal]
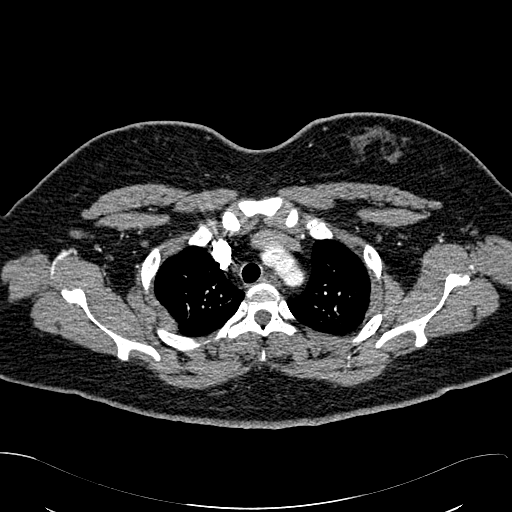
[im 269/303  lung]
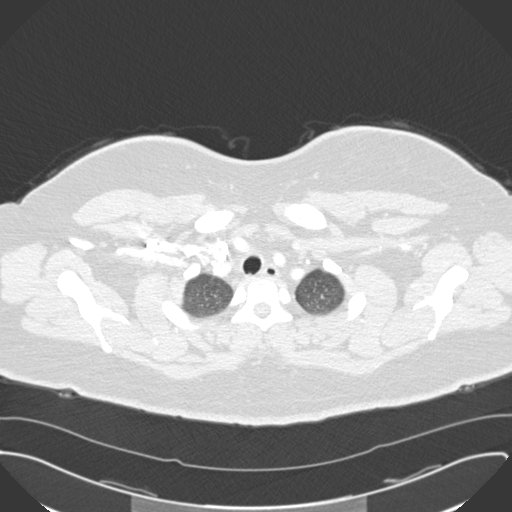
[im 286/303  mediastinal]
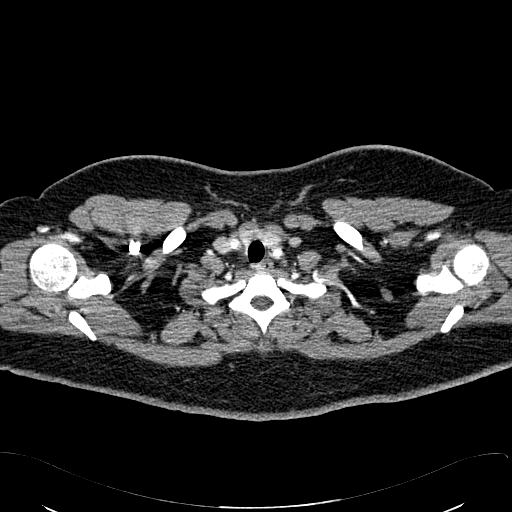

[18 of 30 positions shown; findings below may reference images not displayed]

FINDINGS: The lungs are well aerated bilaterally. Some minimal dependent
patchy atelectatic changes are noted within both lungs. No focal
confluent infiltrate is seen.

The thoracic inlet is within normal limits. The thoracic aorta and
its branches are unremarkable. The pulmonary artery shows a normal
branching pattern. No filling defect to suggest pulmonary embolism
is identified.

The hilar and mediastinal structures show no significant
lymphadenopathy. Cardiac structures are within normal limits.

The visualized upper abdomen is within normal limits. The osseous
structures show no acute abnormality.

Review of the MIP images confirms the above findings.
IMPRESSION: No evidence of pulmonary emboli.

Minimal patchy atelectasis.

## 2019-09-12 ENCOUNTER — Ambulatory Visit: Payer: Self-pay | Attending: Internal Medicine

## 2019-09-12 DIAGNOSIS — Z23 Encounter for immunization: Secondary | ICD-10-CM

## 2019-09-12 NOTE — Progress Notes (Signed)
   Covid-19 Vaccination Clinic  Name:  Vanessa Vasquez    MRN: [id number] DOB: 11-14-1990  09/12/2019  Ms. Thrush was observed post Covid-19 immunization for 15 minutes without incident. She was provided with Vaccine Information Sheet and instruction to access the V-Safe system.   [name] was instructed to call 911 with any severe reactions post vaccine: Marland Kitchen Difficulty breathing  . Swelling of face and throat  . A fast heartbeat  . A bad rash all over body  . Dizziness and weakness   Immunizations Administered    Name Date Dose VIS Date Route   Pfizer COVID-19 Vaccine 09/12/2019  4:56 PM 0.3 mL 06/06/2019 Intramuscular   Manufacturer: [employer], [employer]   Lot: [id number]   NDC: [address]-9

## 2019-10-03 ENCOUNTER — Ambulatory Visit: Payer: Self-pay | Attending: Internal Medicine

## 2019-10-03 DIAGNOSIS — Z23 Encounter for immunization: Secondary | ICD-10-CM

## 2019-10-03 NOTE — Progress Notes (Signed)
   Covid-19 Vaccination Clinic  Name:  Vanessa Vasquez    MRN: [id number] DOB: [birth year]  10/03/2019  Vanessa Vasquez was observed post Covid-19 immunization for 15 minutes without incident. She was provided with Vaccine Information Sheet and instruction to access the V-Safe system.   Vanessa Vasquez was instructed to call 911 with any severe reactions post vaccine: Marland Kitchen Difficulty breathing  . Swelling of face and throat  . A fast heartbeat  . A bad rash all over body  . Dizziness and weakness   Immunizations Administered    Name Date Dose VIS Date Route   Pfizer COVID-19 Vaccine 10/03/2019  4:28 PM 0.3 mL 06/06/2019 Intramuscular   Manufacturer: [employer], [employer]   Lot: [id number]   NDC: [address]-8

## 2020-06-28 ENCOUNTER — Ambulatory Visit: Payer: Self-pay | Attending: Internal Medicine

## 2020-06-28 DIAGNOSIS — Z23 Encounter for immunization: Secondary | ICD-10-CM

## 2020-06-28 NOTE — Progress Notes (Signed)
   Covid-19 Vaccination Clinic  Name:  Vanessa Vasquez    MRN: [id number] DOB: Jun 25, 1991  06/28/2020  [name] was observed post Covid-19 immunization for 15 minutes without incident. She was provided with Vaccine Information Sheet and instruction to access the V-Safe system.   [name] was instructed to call 911 with any severe reactions post vaccine: Marland Kitchen Difficulty breathing  . Swelling of face and throat  . A fast heartbeat  . A bad rash all over body  . Dizziness and weakness   Immunizations Administered    Name Date Dose VIS Date Route   Pfizer COVID-19 Vaccine 06/28/2020  4:09 PM 0.3 mL 04/14/2020 Intramuscular   Manufacturer: [employer], [employer]   Lot: [id number]   NDC: [address]-5
